# Patient Record
Sex: Female | Born: 1960 | Race: White | Hispanic: No
Health system: Southern US, Community
[De-identification: ages and names within clinical notes are randomized; demographics above are authoritative.]

---

## 2015-02-05 ENCOUNTER — Other Ambulatory Visit (HOSPITAL_COMMUNITY): Payer: 59

## 2015-02-05 ENCOUNTER — Inpatient Hospital Stay
Admission: RE | Admit: 2015-02-05 | Discharge: 2015-03-01 | Disposition: A | Discharge status: Skilled Nursing Facility | Payer: 59 | Source: Other Acute Inpatient Hospital | Attending: Internal Medicine | Admitting: Internal Medicine

## 2015-02-05 DIAGNOSIS — Z431 Encounter for attention to gastrostomy: Secondary | ICD-10-CM | POA: Insufficient documentation

## 2015-02-05 DIAGNOSIS — Z930 Tracheostomy status: Secondary | ICD-10-CM

## 2015-02-05 DIAGNOSIS — Z931 Gastrostomy status: Secondary | ICD-10-CM

## 2015-02-05 DIAGNOSIS — J9690 Respiratory failure, unspecified, unspecified whether with hypoxia or hypercapnia: Secondary | ICD-10-CM

## 2015-02-05 DIAGNOSIS — J9600 Acute respiratory failure, unspecified whether with hypoxia or hypercapnia: Secondary | ICD-10-CM | POA: Insufficient documentation

## 2015-02-05 DIAGNOSIS — J9620 Acute and chronic respiratory failure, unspecified whether with hypoxia or hypercapnia: Secondary | ICD-10-CM

## 2015-02-05 LAB — VANCOMYCIN, TROUGH: VANCOMYCIN TR: 8 ug/mL — AB (ref 10.0–20.0)

## 2015-02-05 MED ORDER — IOHEXOL 300 MG/ML  SOLN: 50.0000 mL | Freq: Once | INTRAMUSCULAR | Status: DC | PRN

## 2015-02-06 DIAGNOSIS — Z431 Encounter for attention to gastrostomy: Secondary | ICD-10-CM | POA: Diagnosis not present

## 2015-02-06 DIAGNOSIS — J9600 Acute respiratory failure, unspecified whether with hypoxia or hypercapnia: Secondary | ICD-10-CM | POA: Diagnosis not present

## 2015-02-06 LAB — BLOOD GAS, ARTERIAL
ACID-BASE EXCESS: 1.8 mmol/L (ref 0.0–2.0)
Bicarbonate: 26.1 mEq/L — ABNORMAL HIGH (ref 20.0–24.0)
FIO2: 0.3
MECHVT: 600 mL
O2 Saturation: 99.7 %
PEEP/CPAP: 5 cmH2O
PO2 ART: 213 mmHg — AB (ref 80.0–100.0)
Patient temperature: 98.6
RATE: 10 resp/min
TCO2: 27.4 mmol/L (ref 0–100)
pCO2 arterial: 42.3 mmHg (ref 35.0–45.0)
pH, Arterial: 7.406 (ref 7.350–7.450)

## 2015-02-06 LAB — CBC
HEMATOCRIT: 32.1 % — AB (ref 36.0–46.0)
HEMOGLOBIN: 10 g/dL — AB (ref 12.0–15.0)
MCH: 28.7 pg (ref 26.0–34.0)
MCHC: 31.2 g/dL (ref 30.0–36.0)
MCV: 92.2 fL (ref 78.0–100.0)
Platelets: 194 10*3/uL (ref 150–400)
RBC: 3.48 MIL/uL — ABNORMAL LOW (ref 3.87–5.11)
RDW: 15.9 % — AB (ref 11.5–15.5)
WBC: 6.8 10*3/uL (ref 4.0–10.5)

## 2015-02-06 LAB — C DIFFICILE QUICK SCREEN W PCR REFLEX
C DIFFICILE (CDIFF) INTERP: NEGATIVE
C DIFFICILE (CDIFF) TOXIN: NEGATIVE
C DIFFICLE (CDIFF) ANTIGEN: NEGATIVE

## 2015-02-06 LAB — COMPREHENSIVE METABOLIC PANEL
ALK PHOS: 69 U/L (ref 38–126)
ALT: 50 U/L (ref 14–54)
AST: 29 U/L (ref 15–41)
Albumin: 3.2 g/dL — ABNORMAL LOW (ref 3.5–5.0)
Anion gap: 11 (ref 5–15)
BILIRUBIN TOTAL: 0.7 mg/dL (ref 0.3–1.2)
BUN: 6 mg/dL (ref 6–20)
CALCIUM: 8.8 mg/dL — AB (ref 8.9–10.3)
CO2: 26 mmol/L (ref 22–32)
Chloride: 104 mmol/L (ref 101–111)
Creatinine, Ser: 0.49 mg/dL (ref 0.44–1.00)
GFR calc Af Amer: >60 mL/min (ref >60)
GLUCOSE: 86 mg/dL (ref 65–99)
POTASSIUM: 3.4 mmol/L — AB (ref 3.5–5.1)
Sodium: 141 mmol/L (ref 135–145)
TOTAL PROTEIN: 6.8 g/dL (ref 6.5–8.1)

## 2015-02-06 NOTE — Consult Note (Signed)
Name: Atlee Villers MRN: [id number] DOB: Oct 17, 1960    ADMISSION DATE:  02/05/2015 CONSULTATION DATE:  02/06/15    REFERRING MD :  Despina Pole, MD  CHIEF COMPLAINT:  resp failure  BRIEF PATIENT DESCRIPTION: 71 s/p cervical neck surgery with PE, resp failure, trach  SIGNIFICANT EVENTS  8/4 cervical c4-c6 spine surgery 8/31- arrival [facility]  STUDIES:   HISTORY OF PRESENT ILLNESS:  54 yr old copd, underwent cervical spine surgery 4. Hospital course complicated by PE 8/15 treated with heparin. Developed and was treated for pseudomonal PNA and had pos BC of recent with GPC clusters unknown ID , vanc was added. She required trach. Is on prednisone. Arrived to [facility] on vent. Did wean PS15 as had low volumes on lower PS. She has not become alert yet and is on propofol. She arrives on high TV 600 cc  PAST MEDICAL HISTORY :  VDRF, PE RUL, pseudomonal PNA, COPD, OSA, hypokalemia, GERD, Obesity, OHS Prior to Admission medications   Not on File   Allergies not on file  FAMILY HISTORY:  family history is not on file. unknown, unable unresposnive SOCIAL HISTORY:  unkwown, tried  REVIEW OF SYSTEMS:   unable  SUBJECTIVE: vented  VITAL SIGNS:     135/84, 108, 21, 99.54F  PHYSICAL EXAMINATION: General:sedated rass -4 Neuro:  Per, rass deep -4 HEENT:  Obese, trach wnl Cardiovascular:  s1 s2 RRR distant Lungs:  Coarse cta anterior Abdomen:  Obese, stool leaking, bs wnl, no r/g Musculoskeletal:  No edema Skin:  No rash   Recent Labs Lab 02/06/15 0525  NA 141  K 3.4*  CL 104  CO2 26  BUN 6  CREATININE 0.49  GLUCOSE 86    Recent Labs Lab 02/06/15 0525  HGB 10.0*  HCT 32.1*  WBC 6.8  PLT 194   Dg Chest Port 1 View  02/05/2015   CLINICAL DATA:  Ventilator dependent after surgery  EXAM: PORTABLE CHEST - 1 VIEW  COMPARISON:  None.  FINDINGS: The heart size and mediastinal contours are within normal limits. Both lungs are clear. The visualized skeletal structures  are unremarkable. Cervical fusion hardware noted. Tracheostomy is appropriately positioned. Left IJ approach central line tip terminates over the superior SVC. Diffusely prominent interstitial reticular opacities are noted. Patchy left lower lobe airspace opacity identified.  IMPRESSION: Patchy left lower lobe airspace opacity with overall prominence of the interstitial markings. This could represent atelectasis although early pneumonia could appear similar. If symptoms persist, consider PA and lateral chest radiographs obtained at full inspiration when the patient is clinically able.   Electronically Signed   By: Christiana Pellant M.D.   On: 02/05/2015 22:40   Dg Abd Portable 1v  02/05/2015   CLINICAL DATA:  Check G-tube placement.  Initial encounter.  EXAM: PORTABLE ABDOMEN - 1 VIEW  COMPARISON:  None.  FINDINGS: 50 mL of Omnipaque 300 contrast and 50 mL of water were injected into the patient's G-tube. Provided supine images demonstrate the G-tube balloon overlying the body of the stomach, with injected contrast filling the stomach and proximal duodenum.  The visualized bowel gas pattern is grossly unremarkable. No free intra-abdominal air is seen, though evaluation for free air is limited on supine views. No acute osseous abnormalities are identified. The visualized lung bases are grossly clear.  IMPRESSION: G-tube noted overlying the body of the stomach, with injected contrast filling the stomach and proximal duodenum.   Electronically Signed   By: Roanna Raider M.D.   On:  02/05/2015 22:40    ASSESSMENT / PLAN:  VDRF, s/p Trach S/p PE RUL Pseudomonal PNA treated Pos recent BC, likely contamination Altered mental status COPD OSA OHS GERD  PLAN: Do WUA, if not improved , wil need head CT Continued vanc for now, but repeat BC now, if neg in 48 hr, dc vanc Wean pred quickly to off over 4 days, not required, wil harm to be on steroids BDers Re duce TV to 8 cc/kg ideal body wt = 500, rate to 12,  last abg reviewed, no reason to repeat Wean with WUA in am , PS to goal 12 pcxr follow up, pcxr here looks like ATX Would ensure that pseudomonas got 14 days of abx as was vent associated If delirium , consider Risperdal Have updated med poa in full at bedside as well and d/w Dr Sharyon Medicus above  Mcarthur Rossetti. Tyson Alias, MD, FACP Pgr: [phone] [facility]  Pulmonary and Critical Care Medicine [facility] Pager: [phone]  02/06/2015, 3:28 PM

## 2015-02-07 LAB — POTASSIUM: POTASSIUM: 3.5 mmol/L (ref 3.5–5.1)

## 2015-02-07 LAB — PROTIME-INR
INR: 1.07 (ref 0.00–1.49)
PROTHROMBIN TIME: 14.1 s (ref 11.6–15.2)

## 2015-02-08 LAB — VANCOMYCIN, TROUGH: Vancomycin Tr: 15 ug/mL (ref 10.0–20.0)

## 2015-02-08 LAB — PROTIME-INR
INR: 1.06 (ref 0.00–1.49)
PROTHROMBIN TIME: 14 s (ref 11.6–15.2)

## 2015-02-09 LAB — PROTIME-INR
INR: 1.1 (ref 0.00–1.49)
PROTHROMBIN TIME: 14.4 s (ref 11.6–15.2)

## 2015-02-10 LAB — PROTIME-INR
INR: 1.05 (ref 0.00–1.49)
Prothrombin Time: 13.9 seconds (ref 11.6–15.2)

## 2015-02-10 LAB — CBC
HCT: 30.6 % — ABNORMAL LOW (ref 36.0–46.0)
Hemoglobin: 9.4 g/dL — ABNORMAL LOW (ref 12.0–15.0)
MCH: 28.5 pg (ref 26.0–34.0)
MCHC: 30.7 g/dL (ref 30.0–36.0)
MCV: 92.7 fL (ref 78.0–100.0)
PLATELETS: 235 10*3/uL (ref 150–400)
RBC: 3.3 MIL/uL — AB (ref 3.87–5.11)
RDW: 15.6 % — AB (ref 11.5–15.5)
WBC: 9 10*3/uL (ref 4.0–10.5)

## 2015-02-11 LAB — PROTIME-INR
INR: 1.22 (ref 0.00–1.49)
Prothrombin Time: 15.5 seconds — ABNORMAL HIGH (ref 11.6–15.2)

## 2015-02-11 LAB — CULTURE, BLOOD (ROUTINE X 2)
Culture: NO GROWTH
Culture: NO GROWTH

## 2015-02-12 LAB — PROTIME-INR
INR: 1.05 (ref 0.00–1.49)
Prothrombin Time: 13.9 seconds (ref 11.6–15.2)

## 2015-02-13 ENCOUNTER — Other Ambulatory Visit (HOSPITAL_COMMUNITY): Payer: 59

## 2015-02-13 DIAGNOSIS — J9620 Acute and chronic respiratory failure, unspecified whether with hypoxia or hypercapnia: Secondary | ICD-10-CM

## 2015-02-13 DIAGNOSIS — Z930 Tracheostomy status: Secondary | ICD-10-CM

## 2015-02-13 LAB — PROTIME-INR
INR: 1.14 (ref 0.00–1.49)
PROTHROMBIN TIME: 14.8 s (ref 11.6–15.2)

## 2015-02-13 NOTE — Progress Notes (Signed)
   Name: Danielle Moon MRN: [id number] DOB: [birth year]    ADMISSION DATE:  02/05/2015 CONSULTATION DATE:  02/06/15    REFERRING MD :  Despina Pole, MD  CHIEF COMPLAINT:  resp failure  BRIEF PATIENT DESCRIPTION: 54 s/p cervical neck surgery with PE, resp failure, trach  SIGNIFICANT EVENTS  8/4 cervical c4-c6 spine surgery 8/31- [facility]  STUDIES:   HISTORY OF PRESENT ILLNESS:  54 yr old copd, underwent cervical spine surgery 4. Hospital course complicated by PE 8/15 treated with heparin. Developed and was treated for pseudomonal PNA and had pos BC of recent with GPC clusters unknown ID , vanc was added. She required trach. Is on prednisone. Arrived to [facility] on vent. Did wean PS15 as had low volumes on lower PS. She has not become alert yet and is on propofol. She arrives on high TV 600 cc    SUBJECTIVE: 8 hrs t collar 9/6  VITAL SIGNS:     135/84, 108, 21, 99.12F  PHYSICAL EXAMINATION: General:Obese wf on vent Neuro:  Per, rass 0 , follows commands HEENT:  Obese, trach wnl Cardiovascular:  s1 s2 RRR distant Lungs:  Coarse cta anterior Abdomen:  Obese, stool leaking, bs wnl, no r/g Musculoskeletal:  No edema Skin:  No rash   Recent Labs Lab 02/07/15 0753  K 3.5    Recent Labs Lab 02/10/15 1240  HGB 9.4*  HCT 30.6*  WBC 9.0  PLT 235   No results found.  ASSESSMENT / PLAN:  VDRF, s/p Trach S/p PE RUL Pseudomonal PNA treated Pos recent BC, likely contamination Altered mental status COPD OSA OHS GERD  PLAN: Wean per protocol(goal 12 hr t collar 9/7) BDers    Brett Canales Burr Soffer ACNP [facility] Pager [phone] till 3 pm If no answer page [phone] 02/13/2015, 8:31 AM

## 2015-02-13 NOTE — Consult Note (Unsigned)
NAME:  Danielle Moon, Danielle Moon                ACCOUNT NO.:  [id number]  MEDICAL RECORD NO.:  [id number]  LOCATION:                               FACILITY:  [facility]  PHYSICIAN:  Kristine Garbe. Ezzard Standing, M.D.DATE OF BIRTH:  [birth year]  DATE OF CONSULTATION:  02/12/2015 DATE OF DISCHARGE:                                CONSULTATION   REASON FOR CONSULT:  Evaluate the patient with bleeding around the trach site.  BRIEF HISTORY:  [name] is an obese 54 year old female who is status post tracheotomy 2 weeks ago at an outside hospital.  She developed a respiratory failure following cervical fusion of her neck. She became vent dependent and underwent elective tracheotomy 2 weeks ago.  She was subsequently transferred to [facility] on February 05, 2015, on the ventilator.  She had been having bleeding around the trach site for several days.  Last night, they just used some Surgicel packing around the trach site and this apparently stopped the bleeding and she has had no significant bleeding this morning on exam at bedside.  The trach collar was changed and the trach was loosened and looking around the tracheotomy site, there was no active bleeding, no bright red blood, no swelling, no hematoma noted.  On suction of the trach, there was no blood within the lungs.  She was not spitting out any blood from her mouth.  IMPRESSION:  Status post tracheotomy 2 weeks ago with bleeding around the trach site, presently controlled with Surgicel packing.  RECOMMENDATIONS:  We would leave the surgical packing in place and change the trach sponge as needed.  We will follow up if she has any further bleeding around the trach site.          ______________________________ Kristine Garbe. Ezzard Standing, M.D.     CEN/MEDQ  D:  02/12/2015  T:  02/13/2015  Job:  [id number]

## 2015-02-14 LAB — CBC
HCT: 33 % — ABNORMAL LOW (ref 36.0–46.0)
Hemoglobin: 10.1 g/dL — ABNORMAL LOW (ref 12.0–15.0)
MCH: 28.6 pg (ref 26.0–34.0)
MCHC: 30.6 g/dL (ref 30.0–36.0)
MCV: 93.5 fL (ref 78.0–100.0)
PLATELETS: 356 10*3/uL (ref 150–400)
RBC: 3.53 MIL/uL — ABNORMAL LOW (ref 3.87–5.11)
RDW: 15.9 % — AB (ref 11.5–15.5)
WBC: 11.6 10*3/uL — AB (ref 4.0–10.5)

## 2015-02-14 LAB — BASIC METABOLIC PANEL
ANION GAP: 11 (ref 5–15)
BUN: 9 mg/dL (ref 6–20)
CALCIUM: 9.5 mg/dL (ref 8.9–10.3)
CO2: 30 mmol/L (ref 22–32)
Chloride: 101 mmol/L (ref 101–111)
Creatinine, Ser: 0.58 mg/dL (ref 0.44–1.00)
Glucose, Bld: 109 mg/dL — ABNORMAL HIGH (ref 65–99)
Potassium: 3.6 mmol/L (ref 3.5–5.1)
Sodium: 142 mmol/L (ref 135–145)

## 2015-02-14 LAB — PROTIME-INR
INR: 1.18 (ref 0.00–1.49)
Prothrombin Time: 15.2 seconds (ref 11.6–15.2)

## 2015-02-15 LAB — CULTURE, BLOOD (ROUTINE X 2)
CULTURE: NO GROWTH
CULTURE: NO GROWTH

## 2015-02-15 LAB — PROTIME-INR
INR: 1.41 (ref 0.00–1.49)
Prothrombin Time: 17.4 seconds — ABNORMAL HIGH (ref 11.6–15.2)

## 2015-02-16 LAB — PROTIME-INR
INR: 1.25 (ref 0.00–1.49)
Prothrombin Time: 15.9 seconds — ABNORMAL HIGH (ref 11.6–15.2)

## 2015-02-17 LAB — PROTIME-INR
INR: 1.21 (ref 0.00–1.49)
PROTHROMBIN TIME: 15.4 s — AB (ref 11.6–15.2)

## 2015-02-18 ENCOUNTER — Other Ambulatory Visit (HOSPITAL_COMMUNITY): Payer: 59

## 2015-02-18 DIAGNOSIS — Z930 Tracheostomy status: Secondary | ICD-10-CM | POA: Diagnosis not present

## 2015-02-18 DIAGNOSIS — J9621 Acute and chronic respiratory failure with hypoxia: Secondary | ICD-10-CM | POA: Diagnosis not present

## 2015-02-18 LAB — PROTIME-INR
INR: 1.29 (ref 0.00–1.49)
PROTHROMBIN TIME: 16.3 s — AB (ref 11.6–15.2)

## 2015-02-18 NOTE — Progress Notes (Signed)
   Name: Danielle Moon MRN: [id number] DOB: 04/11/1961    ADMISSION DATE:  02/05/2015 CONSULTATION DATE:  02/06/15    REFERRING MD :  Despina Pole, MD  CHIEF COMPLAINT:  Resp failure  BRIEF PATIENT DESCRIPTION: 54 yr old F with PMH of COPD who underwent cervical spine surgery 4. Hospital course complicated by PE 8/15 treated with heparin. Developed and was treated for pseudomonal PNA and had pos BC of recent with GPC clusters unknown ID , vanc was added. She required trach. Is on prednisone. Arrived to [facility] on vent.   SIGNIFICANT EVENTS  8/04  Cervical c4-c6 spine surgery 8/31  Admit to [facility]  9/06  ATC x 8 hours 9/12  ATC x48 hours continuous, passed MBS  STUDIES:  9/12  MBS >> passed for oral diet    SUBJECTIVE:   VITAL SIGNS: 110/56, 74, 18, 97.5, 97% on 28% ATC   PHYSICAL EXAMINATION: General: Obese WF in NAD Neuro:  Awake, alert, generalized weakness, oriented HEENT:  Obese, trach wnl Cardiovascular:  s1 s2 RRR distant Lungs:  Coarse cta anterior Abdomen:  Obese, NTND, BSx4 active  Musculoskeletal:  No acute deformities  Skin:  No rashes or lesions   Recent Labs Lab 02/14/15 0730  NA 142  K 3.6  CL 101  CO2 30  BUN 9  CREATININE 0.58  GLUCOSE 109*    Recent Labs Lab 02/14/15 0730  HGB 10.1*  HCT 33.0*  WBC 11.6*  PLT 356   No results found.  ASSESSMENT / PLAN:  VDRF, s/p Trach S/p PE RUL Pseudomonal PNA treated Pos recent BC, likely contamination Altered mental status COPD OSA OHS GERD  PLAN: Continue ATC weaning per protocol  Wean O2 for saturations > 905 Pulmonary hygiene Push PT (as cleared from neck surgery) Bronchodilators as needed  Would consider decannulation once mobile / stronger from rehab   PCCM will follow up again 9/19.  Please call sooner if new needs arise.   Canary Brim, NP-C [facility] Pgr: [phone] or if no answer [phone] 02/18/2015, 11:41 AM

## 2015-02-19 LAB — CBC
HCT: 31.2 % — ABNORMAL LOW (ref 36.0–46.0)
Hemoglobin: 9.5 g/dL — ABNORMAL LOW (ref 12.0–15.0)
MCH: 28.1 pg (ref 26.0–34.0)
MCHC: 30.4 g/dL (ref 30.0–36.0)
MCV: 92.3 fL (ref 78.0–100.0)
PLATELETS: 342 10*3/uL (ref 150–400)
RBC: 3.38 MIL/uL — AB (ref 3.87–5.11)
RDW: 15.6 % — ABNORMAL HIGH (ref 11.5–15.5)
WBC: 9.9 10*3/uL (ref 4.0–10.5)

## 2015-02-19 LAB — PROTIME-INR
INR: 1.23 (ref 0.00–1.49)
PROTHROMBIN TIME: 15.6 s — AB (ref 11.6–15.2)

## 2015-02-19 LAB — BASIC METABOLIC PANEL
ANION GAP: 8 (ref 5–15)
BUN: 11 mg/dL (ref 6–20)
CO2: 31 mmol/L (ref 22–32)
Calcium: 9.1 mg/dL (ref 8.9–10.3)
Chloride: 100 mmol/L — ABNORMAL LOW (ref 101–111)
Creatinine, Ser: 0.53 mg/dL (ref 0.44–1.00)
GFR calc Af Amer: >60 mL/min (ref >60)
Glucose, Bld: 124 mg/dL — ABNORMAL HIGH (ref 65–99)
POTASSIUM: 3.6 mmol/L (ref 3.5–5.1)
SODIUM: 139 mmol/L (ref 135–145)

## 2015-02-20 DIAGNOSIS — J9621 Acute and chronic respiratory failure with hypoxia: Secondary | ICD-10-CM | POA: Diagnosis not present

## 2015-02-20 DIAGNOSIS — Z930 Tracheostomy status: Secondary | ICD-10-CM | POA: Diagnosis not present

## 2015-02-20 LAB — RENAL FUNCTION PANEL
Albumin: 3 g/dL — ABNORMAL LOW (ref 3.5–5.0)
Anion gap: 10 (ref 5–15)
BUN: 12 mg/dL (ref 6–20)
CALCIUM: 9.1 mg/dL (ref 8.9–10.3)
CO2: 29 mmol/L (ref 22–32)
CREATININE: 0.53 mg/dL (ref 0.44–1.00)
Chloride: 100 mmol/L — ABNORMAL LOW (ref 101–111)
Glucose, Bld: 102 mg/dL — ABNORMAL HIGH (ref 65–99)
Phosphorus: 5 mg/dL — ABNORMAL HIGH (ref 2.5–4.6)
Potassium: 3.6 mmol/L (ref 3.5–5.1)
SODIUM: 139 mmol/L (ref 135–145)

## 2015-02-20 LAB — CBC
HCT: 30.8 % — ABNORMAL LOW (ref 36.0–46.0)
Hemoglobin: 9.7 g/dL — ABNORMAL LOW (ref 12.0–15.0)
MCH: 28.9 pg (ref 26.0–34.0)
MCHC: 31.5 g/dL (ref 30.0–36.0)
MCV: 91.7 fL (ref 78.0–100.0)
PLATELETS: 334 10*3/uL (ref 150–400)
RBC: 3.36 MIL/uL — AB (ref 3.87–5.11)
RDW: 15.7 % — ABNORMAL HIGH (ref 11.5–15.5)
WBC: 12.1 10*3/uL — AB (ref 4.0–10.5)

## 2015-02-20 LAB — PROTIME-INR
INR: 1.33 (ref 0.00–1.49)
PROTHROMBIN TIME: 16.6 s — AB (ref 11.6–15.2)

## 2015-02-20 NOTE — Progress Notes (Signed)
S:  Called to bedside to assess tracheostomy.  Staff report concerns for dislodgement.    O: General: morbidly obese female in NAD Neuro: awake, intermittent confusion, MAE PULM: speaking around tracheostomy site / full sentences, cuff down, non-labored, upper airway noise, unable to pass suction catheter pass 9 cm, no color change on end tidal CO2 Extremities: warm/dry, good cap refill   A: Dislodged Tracheostomy  Chronic Respiratory Failure OSA  P:  Arrange for beside bronch to assess airway #6 trach & #5,6 XLT trach to bedside  Will attempt replacement of tracheostomy with Attending MD given Hx of OSA, intermittent confusion, cervical fusion.  If unable to replace, she will need mandatory nocturnal bipap   Danielle Brim, NP-C [facility] Pgr: [phone] or if no answer [phone] 02/20/2015, 9:05 AM

## 2015-02-21 LAB — PROTIME-INR
INR: 1.43 (ref 0.00–1.49)
PROTHROMBIN TIME: 17.5 s — AB (ref 11.6–15.2)

## 2015-02-21 LAB — PTH, INTACT AND CALCIUM
Calcium, Total (PTH): 8 mg/dL — ABNORMAL LOW (ref 8.7–10.2)
PTH: 58 pg/mL (ref 15–65)

## 2015-02-22 LAB — PROTIME-INR
INR: 1.47 (ref 0.00–1.49)
Prothrombin Time: 17.9 seconds — ABNORMAL HIGH (ref 11.6–15.2)

## 2015-02-23 LAB — PROTIME-INR
INR: 1.55 — AB (ref 0.00–1.49)
Prothrombin Time: 18.6 seconds — ABNORMAL HIGH (ref 11.6–15.2)

## 2015-02-24 LAB — PROTIME-INR
INR: 1.78 — ABNORMAL HIGH (ref 0.00–1.49)
Prothrombin Time: 20.6 seconds — ABNORMAL HIGH (ref 11.6–15.2)

## 2015-02-25 ENCOUNTER — Other Ambulatory Visit (HOSPITAL_COMMUNITY): Payer: 59

## 2015-02-25 LAB — CBC
HCT: 32.8 % — ABNORMAL LOW (ref 36.0–46.0)
HEMATOCRIT: 36.1 % (ref 36.0–46.0)
Hemoglobin: 10.1 g/dL — ABNORMAL LOW (ref 12.0–15.0)
Hemoglobin: 10.9 g/dL — ABNORMAL LOW (ref 12.0–15.0)
MCH: 28.2 pg (ref 26.0–34.0)
MCH: 27.6 pg (ref 26.0–34.0)
MCHC: 30.8 g/dL (ref 30.0–36.0)
MCHC: 30.2 g/dL (ref 30.0–36.0)
MCV: 91.6 fL (ref 78.0–100.0)
MCV: 91.4 fL (ref 78.0–100.0)
PLATELETS: 324 10*3/uL (ref 150–400)
Platelets: 369 10*3/uL (ref 150–400)
RBC: 3.58 MIL/uL — AB (ref 3.87–5.11)
RBC: 3.95 MIL/uL (ref 3.87–5.11)
RDW: 15.5 % (ref 11.5–15.5)
RDW: 15.5 % (ref 11.5–15.5)
WBC: 10.6 10*3/uL — ABNORMAL HIGH (ref 4.0–10.5)
WBC: 8.9 10*3/uL (ref 4.0–10.5)

## 2015-02-25 LAB — BASIC METABOLIC PANEL
Anion gap: 9 (ref 5–15)
BUN: 9 mg/dL (ref 6–20)
CHLORIDE: 103 mmol/L (ref 101–111)
CO2: 31 mmol/L (ref 22–32)
Calcium: 9.6 mg/dL (ref 8.9–10.3)
Creatinine, Ser: 0.59 mg/dL (ref 0.44–1.00)
GFR calc Af Amer: >60 mL/min (ref >60)
GLUCOSE: 93 mg/dL (ref 65–99)
POTASSIUM: 3.4 mmol/L — AB (ref 3.5–5.1)
Sodium: 143 mmol/L (ref 135–145)

## 2015-02-25 LAB — PROTIME-INR
INR: 1.87 — ABNORMAL HIGH (ref 0.00–1.49)
Prothrombin Time: 21.5 seconds — ABNORMAL HIGH (ref 11.6–15.2)

## 2015-02-25 MED ORDER — IOHEXOL 300 MG/ML  SOLN
50.0000 mL | Freq: Once | INTRAMUSCULAR | Status: DC | PRN
  Administered 2015-02-25: 50 mL via ORAL

## 2015-02-26 LAB — HEMOGLOBIN AND HEMATOCRIT, BLOOD
HCT: 32.1 % — ABNORMAL LOW (ref 36.0–46.0)
Hemoglobin: 10 g/dL — ABNORMAL LOW (ref 12.0–15.0)

## 2015-02-26 LAB — POTASSIUM: POTASSIUM: 4.2 mmol/L (ref 3.5–5.1)

## 2015-02-26 LAB — PROTIME-INR
INR: 1.97 — ABNORMAL HIGH (ref 0.00–1.49)
PROTHROMBIN TIME: 22.3 s — AB (ref 11.6–15.2)

## 2015-02-27 LAB — PROTIME-INR
INR: 2.2 — ABNORMAL HIGH (ref 0.00–1.49)
PROTHROMBIN TIME: 24.2 s — AB (ref 11.6–15.2)

## 2015-02-27 LAB — CBC
HCT: 29.5 % — ABNORMAL LOW (ref 36.0–46.0)
Hemoglobin: 9 g/dL — ABNORMAL LOW (ref 12.0–15.0)
MCH: 28 pg (ref 26.0–34.0)
MCHC: 30.5 g/dL (ref 30.0–36.0)
MCV: 91.6 fL (ref 78.0–100.0)
PLATELETS: 327 10*3/uL (ref 150–400)
RBC: 3.22 MIL/uL — ABNORMAL LOW (ref 3.87–5.11)
RDW: 15.8 % — AB (ref 11.5–15.5)
WBC: 10.7 10*3/uL — ABNORMAL HIGH (ref 4.0–10.5)

## 2015-02-27 LAB — BASIC METABOLIC PANEL
Anion gap: 9 (ref 5–15)
BUN: 20 mg/dL (ref 6–20)
CALCIUM: 8.6 mg/dL — AB (ref 8.9–10.3)
CO2: 27 mmol/L (ref 22–32)
CREATININE: 0.57 mg/dL (ref 0.44–1.00)
Chloride: 102 mmol/L (ref 101–111)
GFR calc Af Amer: >60 mL/min (ref >60)
GLUCOSE: 94 mg/dL (ref 65–99)
POTASSIUM: 4 mmol/L (ref 3.5–5.1)
Sodium: 138 mmol/L (ref 135–145)

## 2015-02-28 DIAGNOSIS — J9621 Acute and chronic respiratory failure with hypoxia: Secondary | ICD-10-CM | POA: Diagnosis not present

## 2015-02-28 LAB — CBC
HCT: 28.4 % — ABNORMAL LOW (ref 36.0–46.0)
Hemoglobin: 8.7 g/dL — ABNORMAL LOW (ref 12.0–15.0)
MCH: 27.8 pg (ref 26.0–34.0)
MCHC: 30.6 g/dL (ref 30.0–36.0)
MCV: 90.7 fL (ref 78.0–100.0)
PLATELETS: 331 10*3/uL (ref 150–400)
RBC: 3.13 MIL/uL — AB (ref 3.87–5.11)
RDW: 15.9 % — AB (ref 11.5–15.5)
WBC: 11.4 10*3/uL — AB (ref 4.0–10.5)

## 2015-02-28 LAB — PROTIME-INR
INR: 2.53 — AB (ref 0.00–1.49)
Prothrombin Time: 26.9 seconds — ABNORMAL HIGH (ref 11.6–15.2)

## 2015-02-28 NOTE — Progress Notes (Signed)
PCCM PROGRESS NOTE  No issues since tracheostomy d/c'ed.  She gets claustrophobia from BiPAP and has not used consistently.  She is using nasal cannula.  Denies chest pain or cough.  She is anxious to go to [place].  Will defer further management to Primary team.  PCCM will sign off.  Coralyn Helling, MD [facility] Pulmonary/Critical Care 02/28/2015, 10:32 AM Pager:  [phone] After 3pm call: [phone]

## 2015-03-01 LAB — PROTIME-INR
INR: 2.9 — ABNORMAL HIGH (ref 0.00–1.49)
PROTHROMBIN TIME: 29.8 s — AB (ref 11.6–15.2)

## 2017-03-16 IMAGING — CR DG ABD PORTABLE 1V
2 series · 2 of 2 positions shown · non-contrast
Comparison: None.

CLINICAL DATA: Check G-tube placement.  Initial encounter.

EXAM:
PORTABLE ABDOMEN - 1 VIEW

[AP (1 of 2)]
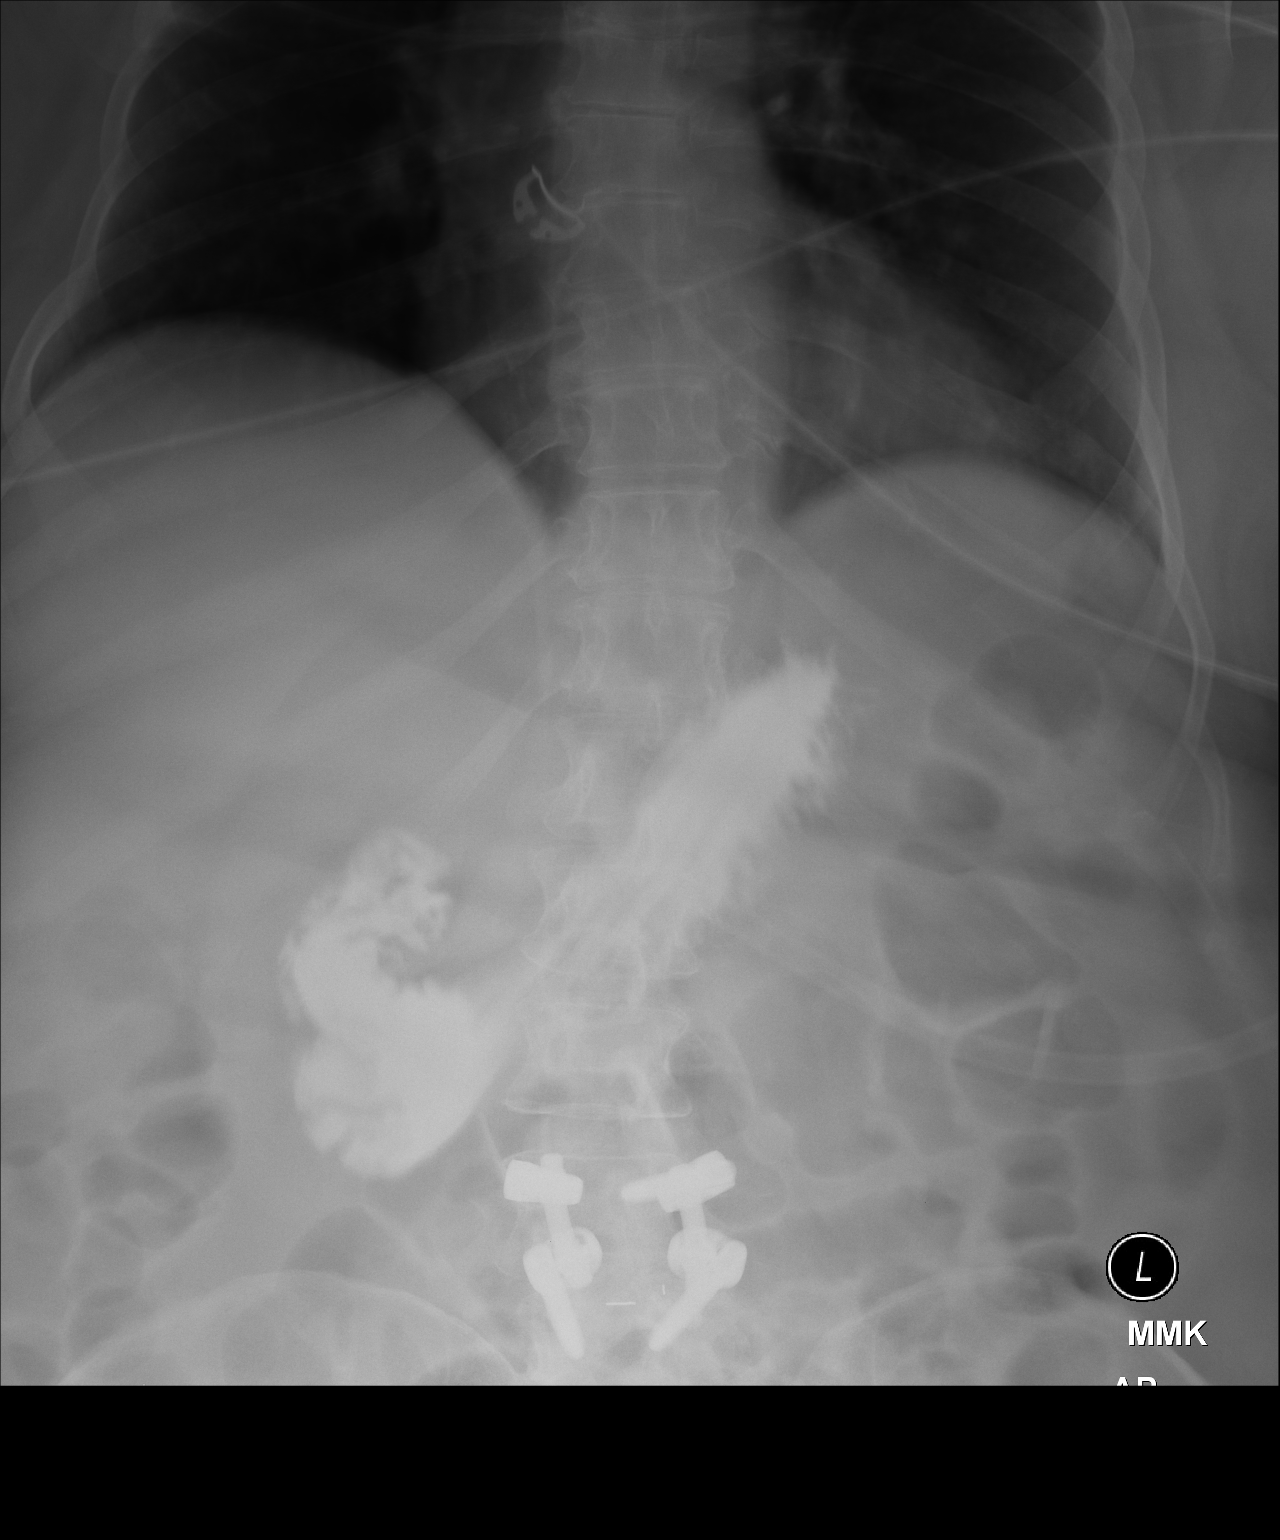

[AP (2 of 2)]
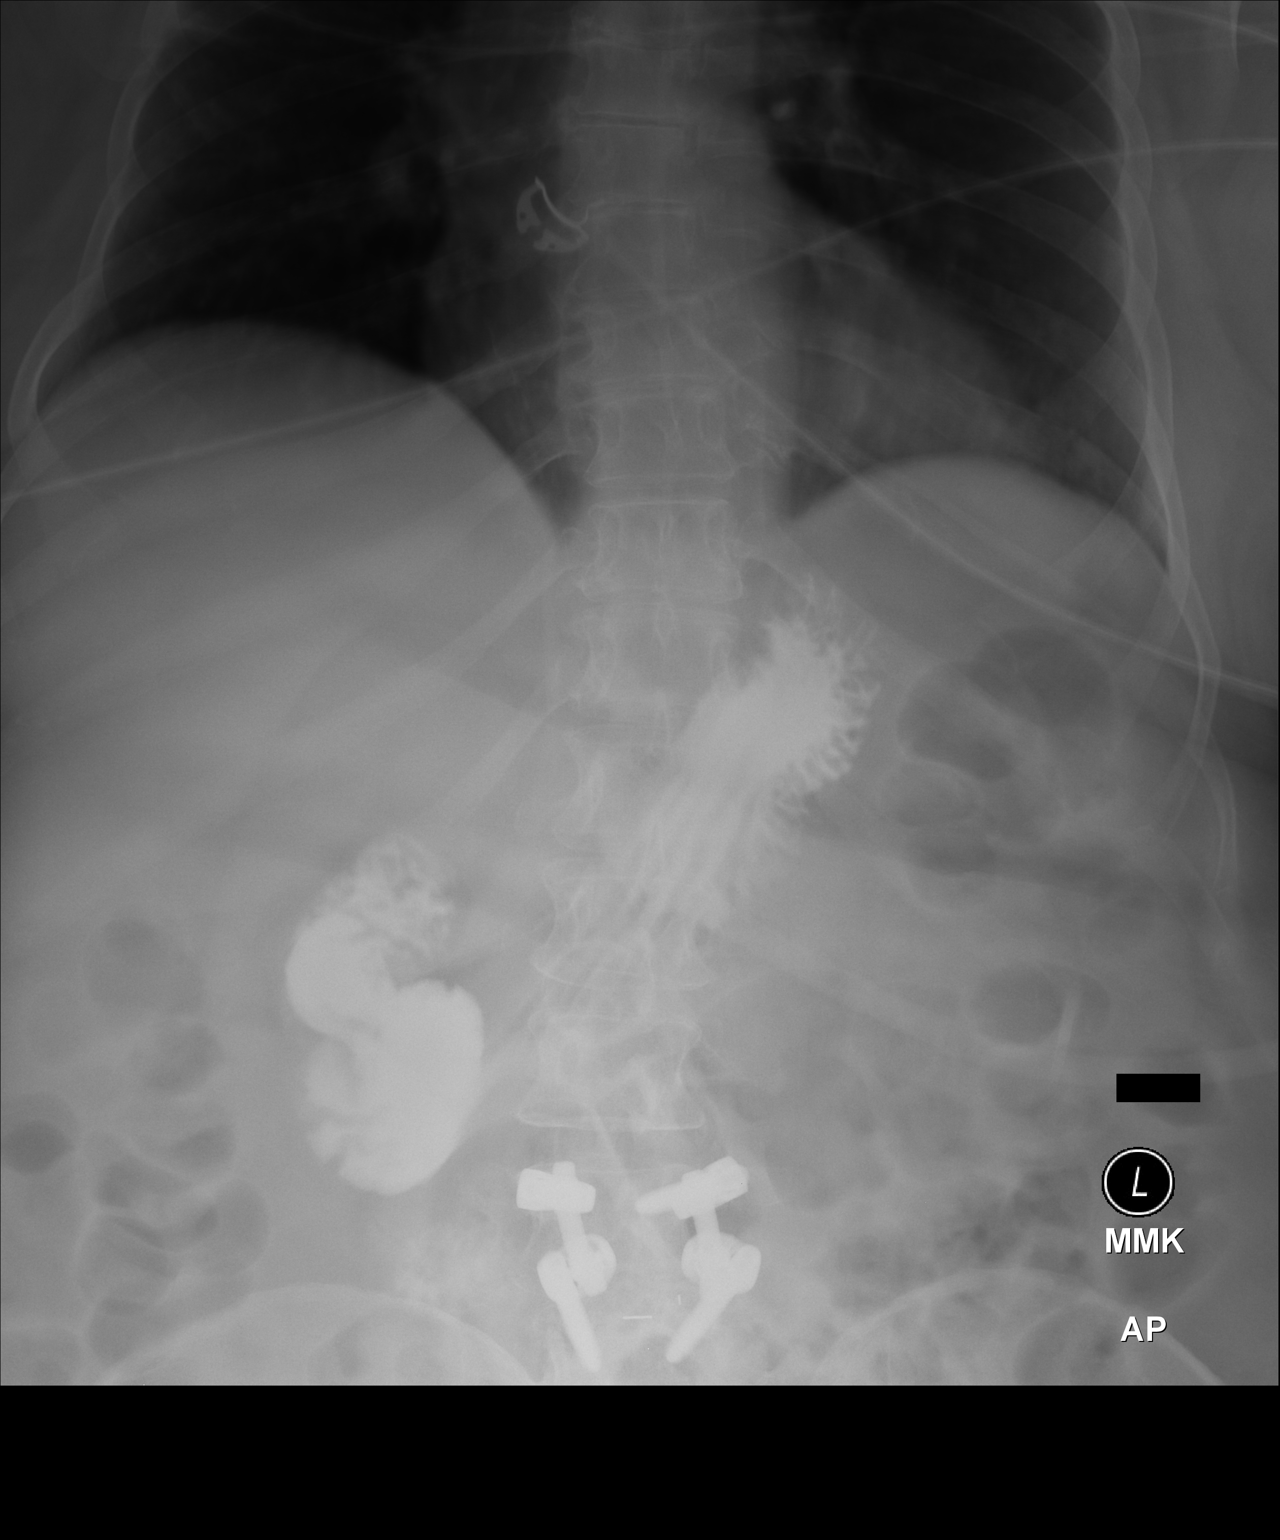

[2 of 2 positions shown; findings below may reference images not displayed]

FINDINGS: 50 mL of Omnipaque 300 contrast and 50 mL of water were injected
into the patient's G-tube. Provided supine images demonstrate the
G-tube balloon overlying the body of the stomach, with injected
contrast filling the stomach and proximal duodenum.

The visualized bowel gas pattern is grossly unremarkable. No free
intra-abdominal air is seen, though evaluation for free air is
limited on supine views. No acute osseous abnormalities are
identified. The visualized lung bases are grossly clear.
IMPRESSION: G-tube noted overlying the body of the stomach, with injected
contrast filling the stomach and proximal duodenum.

## 2017-03-16 IMAGING — CR DG CHEST 1V PORT
1 series · 1 of 1 positions shown · non-contrast
Comparison: None.

CLINICAL DATA: Ventilator dependent after surgery

EXAM:
PORTABLE CHEST - 1 VIEW

[AP]
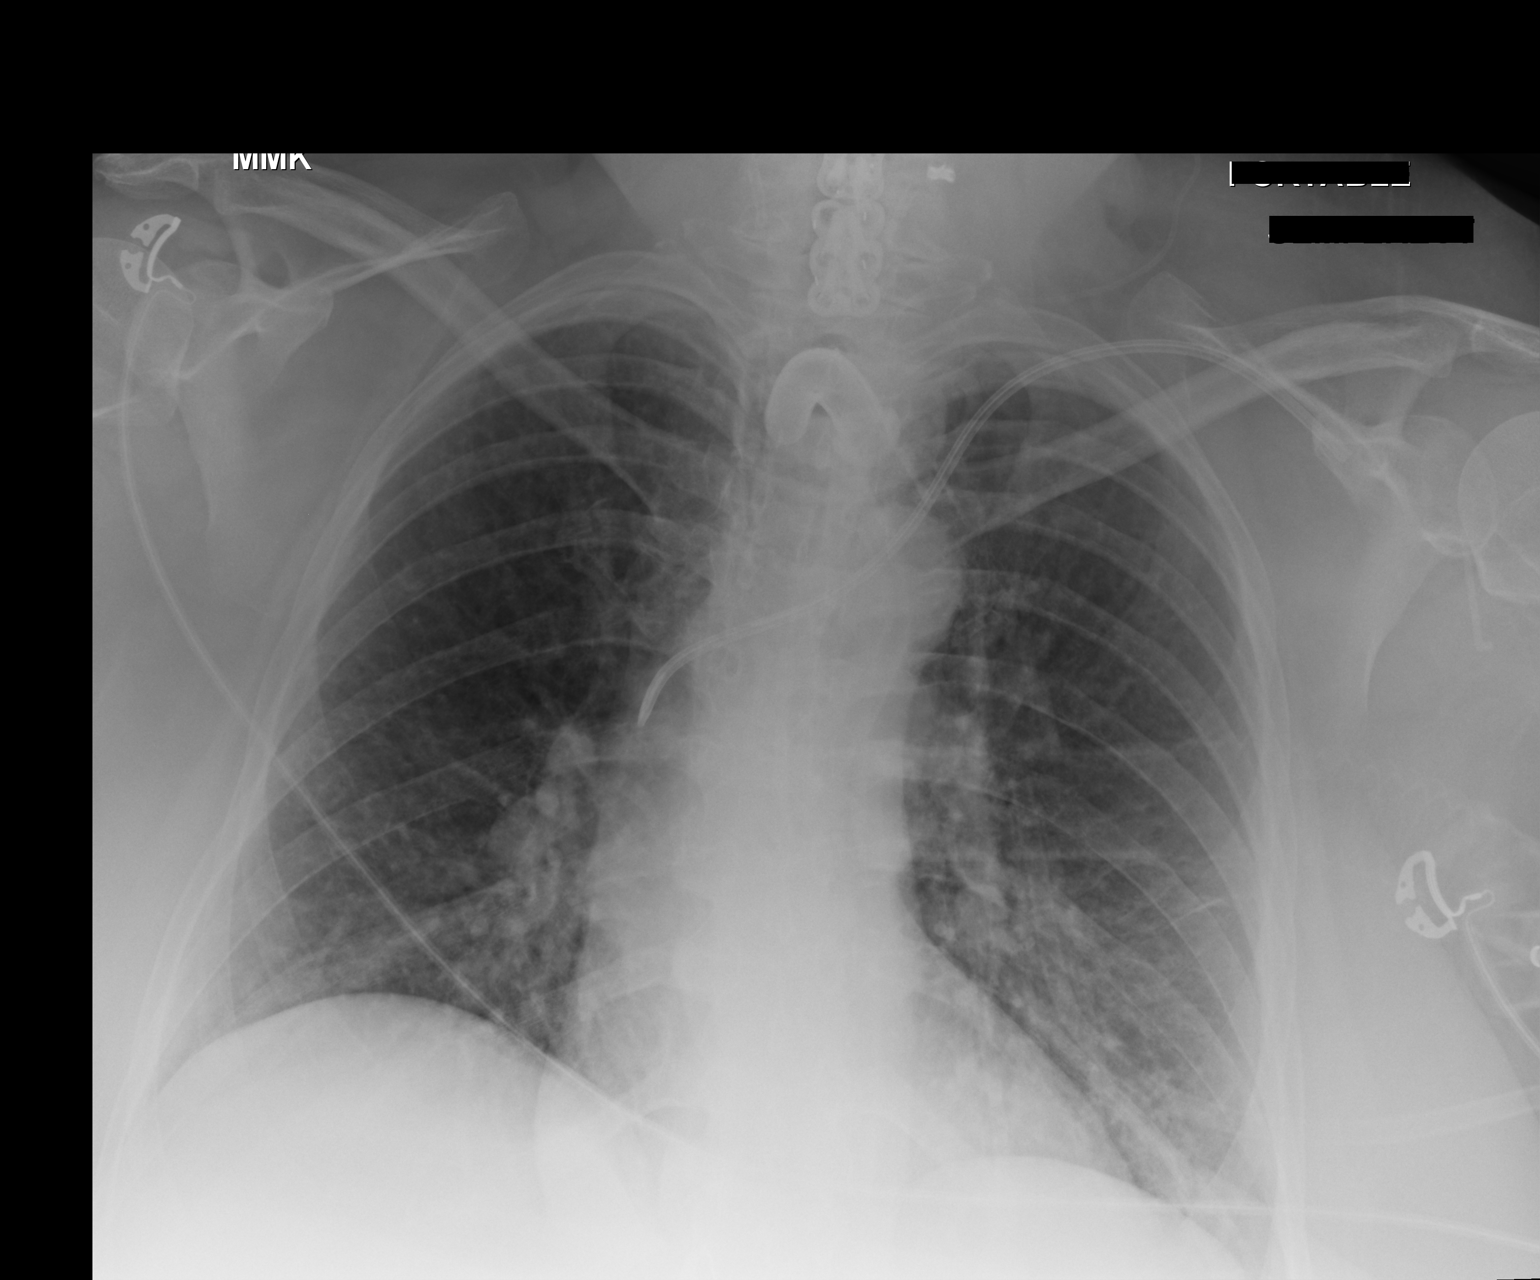

[1 of 1 positions shown; findings below may reference images not displayed]

FINDINGS: The heart size and mediastinal contours are within normal limits.
Both lungs are clear. The visualized skeletal structures are
unremarkable. Cervical fusion hardware noted. Tracheostomy is
appropriately positioned. Left IJ approach central line tip
terminates over the superior SVC. Diffusely prominent interstitial
reticular opacities are noted. Patchy left lower lobe airspace
opacity identified.
IMPRESSION: Patchy left lower lobe airspace opacity with overall prominence of
the interstitial markings. This could represent atelectasis although
early pneumonia could appear similar. If symptoms persist, consider
PA and lateral chest radiographs obtained at full inspiration when
the patient is clinically able.
